# Patient Record
Sex: Male | Born: 1975 | Race: Black or African American | Hispanic: No | Marital: Single | Smoking: Never smoker
Health system: Southern US, Community
[De-identification: ages and names within clinical notes are randomized; demographics above are authoritative.]

---

## 2010-01-29 ENCOUNTER — Emergency Department (HOSPITAL_COMMUNITY): Admission: EM | Admit: 2010-01-29 | Discharge: 2010-01-29 | Payer: Self-pay | Admitting: Emergency Medicine
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
## 2017-03-01 ENCOUNTER — Emergency Department (HOSPITAL_BASED_OUTPATIENT_CLINIC_OR_DEPARTMENT_OTHER): Payer: Self-pay

## 2017-03-01 ENCOUNTER — Emergency Department (HOSPITAL_BASED_OUTPATIENT_CLINIC_OR_DEPARTMENT_OTHER)
Admission: EM | Admit: 2017-03-01 | Discharge: 2017-03-02 | Disposition: A | Discharge status: Home / Self Care | Payer: Self-pay | Attending: Emergency Medicine | Admitting: Emergency Medicine

## 2017-03-01 ENCOUNTER — Encounter (HOSPITAL_BASED_OUTPATIENT_CLINIC_OR_DEPARTMENT_OTHER): Payer: Self-pay | Admitting: *Deleted

## 2017-03-01 DIAGNOSIS — F12120 Cannabis abuse with intoxication, uncomplicated: Secondary | ICD-10-CM | POA: Insufficient documentation

## 2017-03-01 DIAGNOSIS — Z5181 Encounter for therapeutic drug level monitoring: Secondary | ICD-10-CM | POA: Insufficient documentation

## 2017-03-01 DIAGNOSIS — R55 Syncope and collapse: Secondary | ICD-10-CM

## 2017-03-01 DIAGNOSIS — F12920 Cannabis use, unspecified with intoxication, uncomplicated: Secondary | ICD-10-CM

## 2017-03-01 LAB — CBG MONITORING, ED: GLUCOSE-CAPILLARY: 73 mg/dL (ref 65–99)

## 2017-03-01 NOTE — ED Provider Notes (Signed)
[facility] Provider Note   CSN: [id number] Arrival date & time: 03/01/17  2245  By signing my name below, I, Nelwyn Salisbury, attest that this documentation has been prepared under the direction and in the presence of Franko Hilliker, MD . Electronically Signed: Nelwyn Salisbury, Scribe. 03/01/2017. 11:59 PM.  History   Chief Complaint Chief Complaint  Patient presents with  . Loss of Consciousness   The history is provided by the patient. No language interpreter was used.  Loss of Consciousness   This is a new problem. The current episode started 3 to 5 hours ago. The problem occurs rarely. The problem has not changed since onset.He lost consciousness for a period of less than one minute. The problem is associated with normal activity. Associated symptoms include light-headedness. Pertinent negatives include abdominal pain, back pain, bladder incontinence, bowel incontinence, chest pain, clumsiness, confusion, congestion, diaphoresis, dizziness, fever, focal sensory loss, focal weakness, headaches, malaise/fatigue, nausea, palpitations, seizures, slurred speech, vertigo, visual change, vomiting and weakness. He has tried nothing for the symptoms. The treatment provided no relief. His past medical history does not include CAD or CVA.    HPI Comments:  Allen Hicks is an otherwise healthy 41 y.o. male who presents to the Emergency Department having had two episodes of syncope earlier today. Pt states that he was watching television when he felt light-headed and went outside for air. He notes that he still did not feel well and so came inside to lay down when he passed out. Pt was moved to the couch by his friends, where he came to briefly before losing consciousness again. He denies any potentially triggering events or modifying factors. Pt also denies any vomiting, diarrhea, dysuria, ear pain or LE swelling. No recent hx of travel. He reports that he has been sick recently with some  cold-like symptoms. Pt's po intake has been normal today and he denies eating anything unusual.   History reviewed. No pertinent past medical history.  There are no active problems to display for this patient.   History reviewed. No pertinent surgical history.     Home Medications    Prior to Admission medications   Not on File    Family History No family history on file.  Social History Social History  Substance Use Topics  . Smoking status: Never Smoker  . Smokeless tobacco: Never Used  . Alcohol use Yes     Allergies   Patient has no known allergies.   Review of Systems Review of Systems  Constitutional: Negative for diaphoresis, fever and malaise/fatigue.  HENT: Negative for congestion and ear pain.   Respiratory: Negative for cough, choking, chest tightness, shortness of breath and stridor.   Cardiovascular: Positive for syncope. Negative for chest pain, palpitations and leg swelling.  Gastrointestinal: Negative for abdominal pain, bowel incontinence, diarrhea, nausea and vomiting.  Genitourinary: Negative for bladder incontinence and dysuria.  Musculoskeletal: Negative for back pain.  Neurological: Positive for light-headedness. Negative for dizziness, vertigo, tremors, focal weakness, seizures, syncope, facial asymmetry, speech difficulty, weakness, numbness and headaches.  Psychiatric/Behavioral: Negative for confusion.  All other systems reviewed and are negative.    Physical Exam Updated Vital Signs BP 126/70 (BP Location: Right Arm)   Pulse 82   Temp 97.8 F (36.6 C) (Oral)   Resp 18   Ht 5\' 6"  (1.676 m)   Wt 180 lb (81.6 kg)   SpO2 99%   BMI 29.05 kg/m   Physical Exam  Constitutional: He is oriented to person,  place, and time. He appears well-developed and well-nourished. No distress.  HENT:  Head: Normocephalic and atraumatic.  Mouth/Throat: Oropharynx is clear and moist. No oropharyngeal exudate.  Eyes: Conjunctivae and EOM are normal.  Pupils are equal, round, and reactive to light.  Neck: Normal range of motion. Neck supple. Carotid bruit is not present.  Cardiovascular: Normal rate, regular rhythm and normal heart sounds.   No murmur heard. Pulmonary/Chest: Effort normal and breath sounds normal. No stridor. No respiratory distress. He has no wheezes. He has no rales.  Abdominal: Soft. He exhibits no distension.  Hyperactive bowel sounds. Constipated.   Musculoskeletal: Normal range of motion. He exhibits no edema, tenderness or deformity.  All compartments soft. No cords. Negative Homan's.  Neurological: He is alert and oriented to person, place, and time. He displays normal reflexes. No cranial nerve deficit or sensory deficit. He exhibits normal muscle tone. Coordination normal.  CN II-XII intact. 5/5 Strength and Sensation throughout.   Skin: Skin is warm and dry. Capillary refill takes less than 2 seconds. He is not diaphoretic.  Psychiatric: He has a normal mood and affect.     ED Treatments / Results   Vitals:   03/01/17 2251  BP: 126/70  Pulse: 82  Resp: 18  Temp: 97.8 F (36.6 C)     DIAGNOSTIC STUDIES:  Oxygen Saturation is 99% on RA, normal by my interpretation.    COORDINATION OF CARE:  12:08 AM Discussed treatment plan with pt at bedside which includes blood work and pt agreed to plan.  Labs (all labs ordered are listed, but only abnormal results are displayed) Results for orders placed or performed during the hospital encounter of 03/01/17  CBC with Differential/Platelet  Result Value Ref Range   WBC 11.7 (H) 4.0 - 10.5 K/uL   RBC 4.95 4.22 - 5.81 MIL/uL   Hemoglobin 14.8 13.0 - 17.0 g/dL   HCT 29.5 62.1 - 30.8 %   MCV 86.5 78.0 - 100.0 fL   MCH 29.9 26.0 - 34.0 pg   MCHC 34.6 30.0 - 36.0 g/dL   RDW 65.7 84.6 - 96.2 %   Platelets 263 150 - 400 K/uL   Neutrophils Relative % 71 %   Neutro Abs 8.4 (H) 1.7 - 7.7 K/uL   Lymphocytes Relative 18 %   Lymphs Abs 2.0 0.7 - 4.0 K/uL    Monocytes Relative 9 %   Monocytes Absolute 1.0 0.1 - 1.0 K/uL   Eosinophils Relative 2 %   Eosinophils Absolute 0.2 0.0 - 0.7 K/uL   Basophils Relative 0 %   Basophils Absolute 0.0 0.0 - 0.1 K/uL  Basic metabolic panel  Result Value Ref Range   Sodium 137 135 - 145 mmol/L   Potassium 4.0 3.5 - 5.1 mmol/L   Chloride 107 101 - 111 mmol/L   CO2 23 22 - 32 mmol/L   Glucose, Bld 101 (H) 65 - 99 mg/dL   BUN 14 6 - 20 mg/dL   Creatinine, Ser 9.52 0.61 - 1.24 mg/dL   Calcium 9.1 8.9 - 84.1 mg/dL   GFR calc non Af Amer >60 >60 mL/min   GFR calc Af Amer >60 >60 mL/min   Anion gap 7 5 - 15  Troponin I  Result Value Ref Range   Troponin I <0.03 <0.03 ng/mL  Urinalysis, Routine w reflex microscopic  Result Value Ref Range   Color, Urine AMBER (A) YELLOW   APPearance CLEAR CLEAR   Specific Gravity, Urine 1.031 (H) 1.005 - 1.030  pH 5.5 5.0 - 8.0   Glucose, UA NEGATIVE NEGATIVE mg/dL   Hgb urine dipstick NEGATIVE NEGATIVE   Bilirubin Urine NEGATIVE NEGATIVE   Ketones, ur NEGATIVE NEGATIVE mg/dL   Protein, ur 30 (A) NEGATIVE mg/dL   Nitrite NEGATIVE NEGATIVE   Leukocytes, UA NEGATIVE NEGATIVE  Urinalysis, Microscopic (reflex)  Result Value Ref Range   RBC / HPF 0-5 0 - 5 RBC/hpf   WBC, UA 0-5 0 - 5 WBC/hpf   Bacteria, UA FEW (A) NONE SEEN   Squamous Epithelial / LPF 0-5 (A) NONE SEEN   Mucous PRESENT    Hyaline Casts, UA PRESENT    Granular Casts, UA PRESENT   POC CBG, ED  Result Value Ref Range   Glucose-Capillary 73 65 - 99 mg/dL   Dg Chest 2 View  Result Date: 03/02/2017 CLINICAL DATA:  Occasional cough. Syncope x2 today. History of syncopal episodes. EXAM: CHEST  2 VIEW COMPARISON:  01/29/2010 FINDINGS: The heart size and mediastinal contours are within normal limits. Both lungs are clear. The visualized skeletal structures are unremarkable. IMPRESSION: No active cardiopulmonary disease. Electronically Signed   By: Burman NievesWilliam  Stevens M.D.   On: 03/02/2017 00:01   Ct Head Wo  Contrast  Result Date: 03/01/2017 CLINICAL DATA:  Two episodes of syncope today with right frontal swelling after a fall. History of syncope but never twice in 1 day. EXAM: CT HEAD WITHOUT CONTRAST TECHNIQUE: Contiguous axial images were obtained from the base of the skull through the vertex without intravenous contrast. COMPARISON:  None. FINDINGS: Brain: No evidence of acute infarction, hemorrhage, hydrocephalus, extra-axial collection or mass lesion/mass effect. Vascular: No hyperdense vessel or unexpected calcification. Skull: Calvarium appears intact. Sinuses/Orbits: Old fracture deformity of the left medial orbital wall. Mucosal thickening throughout the paranasal sinuses. No acute air-fluid levels. Mastoid air cells are not opacified. Other: None. IMPRESSION: No acute intracranial abnormalities. Inflammatory mucosal thickening in the paranasal sinuses. Old fracture deformity of the medial left orbital wall. Electronically Signed   By: Burman NievesWilliam  Stevens M.D.   On: 03/01/2017 23:57    EKG  EKG Interpretation  Date/Time:  Tuesday March 01 2017 22:56:36 EDT Ventricular Rate:  72 PR Interval:  164 QRS Duration: 84 QT Interval:  376 QTC Calculation: 411 R Axis:   79 Text Interpretation:  Normal sinus rhythm Confirmed by [facility]  [facility], Burnell Matlin ([id number]) on 03/01/2017 11:22:21 PM       Radiology No results found.  Procedures Procedures (including critical care time)  Medications Ordered in ED  Medications  sodium chloride 0.9 % bolus 1,000 mL (1,000 mLs Intravenous New Bag/Given 03/02/17 0032)    PERC negative wells 0 highly doubt PE in this very low risk patient.  Not eating and drinking well due to cold this week likely had vasovagal episode as is orthostatic in the ED Final Clinical Impressions(s) / ED Diagnoses  Vasovagal syncope:  well appearing, normal vital signs.  I suspect the THC in the patient's system also played a part.    Based on history and exam patient has been  appropriately medically screened and emergency conditions excluded. Patient is stable for discharge at this time. Strict return precautions given for fever shortness of breath, chest pain, leg pain or swelling,  fevers, vomiting,weakness numbness difficult in making or understanding speech or any concerns.   Follow up with your PMD in 2 days for recheck   I personally performed the services described in this documentation, which was scribed in my presence. The recorded information  has been reviewed and is accurate.       Cy Blamer, MD 03/02/17 [id number]

## 2017-03-01 NOTE — ED Triage Notes (Signed)
States he got hot, light headed and passed out tonight. He hit his forehead and has a small hematoma to his right forehead. He is alert oriented. States he did not eat very much today due to low appetite.

## 2017-03-02 ENCOUNTER — Encounter (HOSPITAL_BASED_OUTPATIENT_CLINIC_OR_DEPARTMENT_OTHER): Payer: Self-pay | Admitting: Emergency Medicine

## 2017-03-02 LAB — URINALYSIS, MICROSCOPIC (REFLEX)

## 2017-03-02 LAB — URINALYSIS, ROUTINE W REFLEX MICROSCOPIC
Bilirubin Urine: NEGATIVE
Glucose, UA: NEGATIVE mg/dL
Hgb urine dipstick: NEGATIVE
Ketones, ur: NEGATIVE mg/dL
LEUKOCYTES UA: NEGATIVE
NITRITE: NEGATIVE
PH: 5.5 (ref 5.0–8.0)
Protein, ur: 30 mg/dL — AB
SPECIFIC GRAVITY, URINE: 1.031 — AB (ref 1.005–1.030)

## 2017-03-02 LAB — CBC WITH DIFFERENTIAL/PLATELET
BASOS ABS: 0 10*3/uL (ref 0.0–0.1)
BASOS PCT: 0 %
Eosinophils Absolute: 0.2 10*3/uL (ref 0.0–0.7)
Eosinophils Relative: 2 %
HEMATOCRIT: 42.8 % (ref 39.0–52.0)
HEMOGLOBIN: 14.8 g/dL (ref 13.0–17.0)
LYMPHS PCT: 18 %
Lymphs Abs: 2 10*3/uL (ref 0.7–4.0)
MCH: 29.9 pg (ref 26.0–34.0)
MCHC: 34.6 g/dL (ref 30.0–36.0)
MCV: 86.5 fL (ref 78.0–100.0)
MONO ABS: 1 10*3/uL (ref 0.1–1.0)
MONOS PCT: 9 %
NEUTROS ABS: 8.4 10*3/uL — AB (ref 1.7–7.7)
NEUTROS PCT: 71 %
Platelets: 263 10*3/uL (ref 150–400)
RBC: 4.95 MIL/uL (ref 4.22–5.81)
RDW: 13.2 % (ref 11.5–15.5)
WBC: 11.7 10*3/uL — ABNORMAL HIGH (ref 4.0–10.5)

## 2017-03-02 LAB — RAPID URINE DRUG SCREEN, HOSP PERFORMED
Amphetamines: NOT DETECTED
BARBITURATES: NOT DETECTED
Benzodiazepines: NOT DETECTED
COCAINE: NOT DETECTED
Opiates: NOT DETECTED
TETRAHYDROCANNABINOL: POSITIVE — AB

## 2017-03-02 LAB — BASIC METABOLIC PANEL
ANION GAP: 7 (ref 5–15)
BUN: 14 mg/dL (ref 6–20)
CHLORIDE: 107 mmol/L (ref 101–111)
CO2: 23 mmol/L (ref 22–32)
Calcium: 9.1 mg/dL (ref 8.9–10.3)
Creatinine, Ser: 1.2 mg/dL (ref 0.61–1.24)
GFR calc non Af Amer: >60 mL/min (ref >60)
GLUCOSE: 101 mg/dL — AB (ref 65–99)
Potassium: 4 mmol/L (ref 3.5–5.1)
Sodium: 137 mmol/L (ref 135–145)

## 2017-03-02 LAB — TROPONIN I: Troponin I: <0.03 ng/mL (ref <0.03)

## 2017-03-02 MED ORDER — SODIUM CHLORIDE 0.9 % IV BOLUS (SEPSIS)
1000.0000 mL | Freq: Once | INTRAVENOUS | Status: AC
  Administered 2017-03-02: 1000 mL via INTRAVENOUS

## 2018-03-03 IMAGING — CR DG CHEST 2V
2 series · 2 of 2 positions shown · non-contrast
Comparison: 01/29/2010

CLINICAL DATA: Occasional cough. Syncope x2 today. History of
syncopal episodes.

EXAM:
CHEST  2 VIEW

[w chest pa]
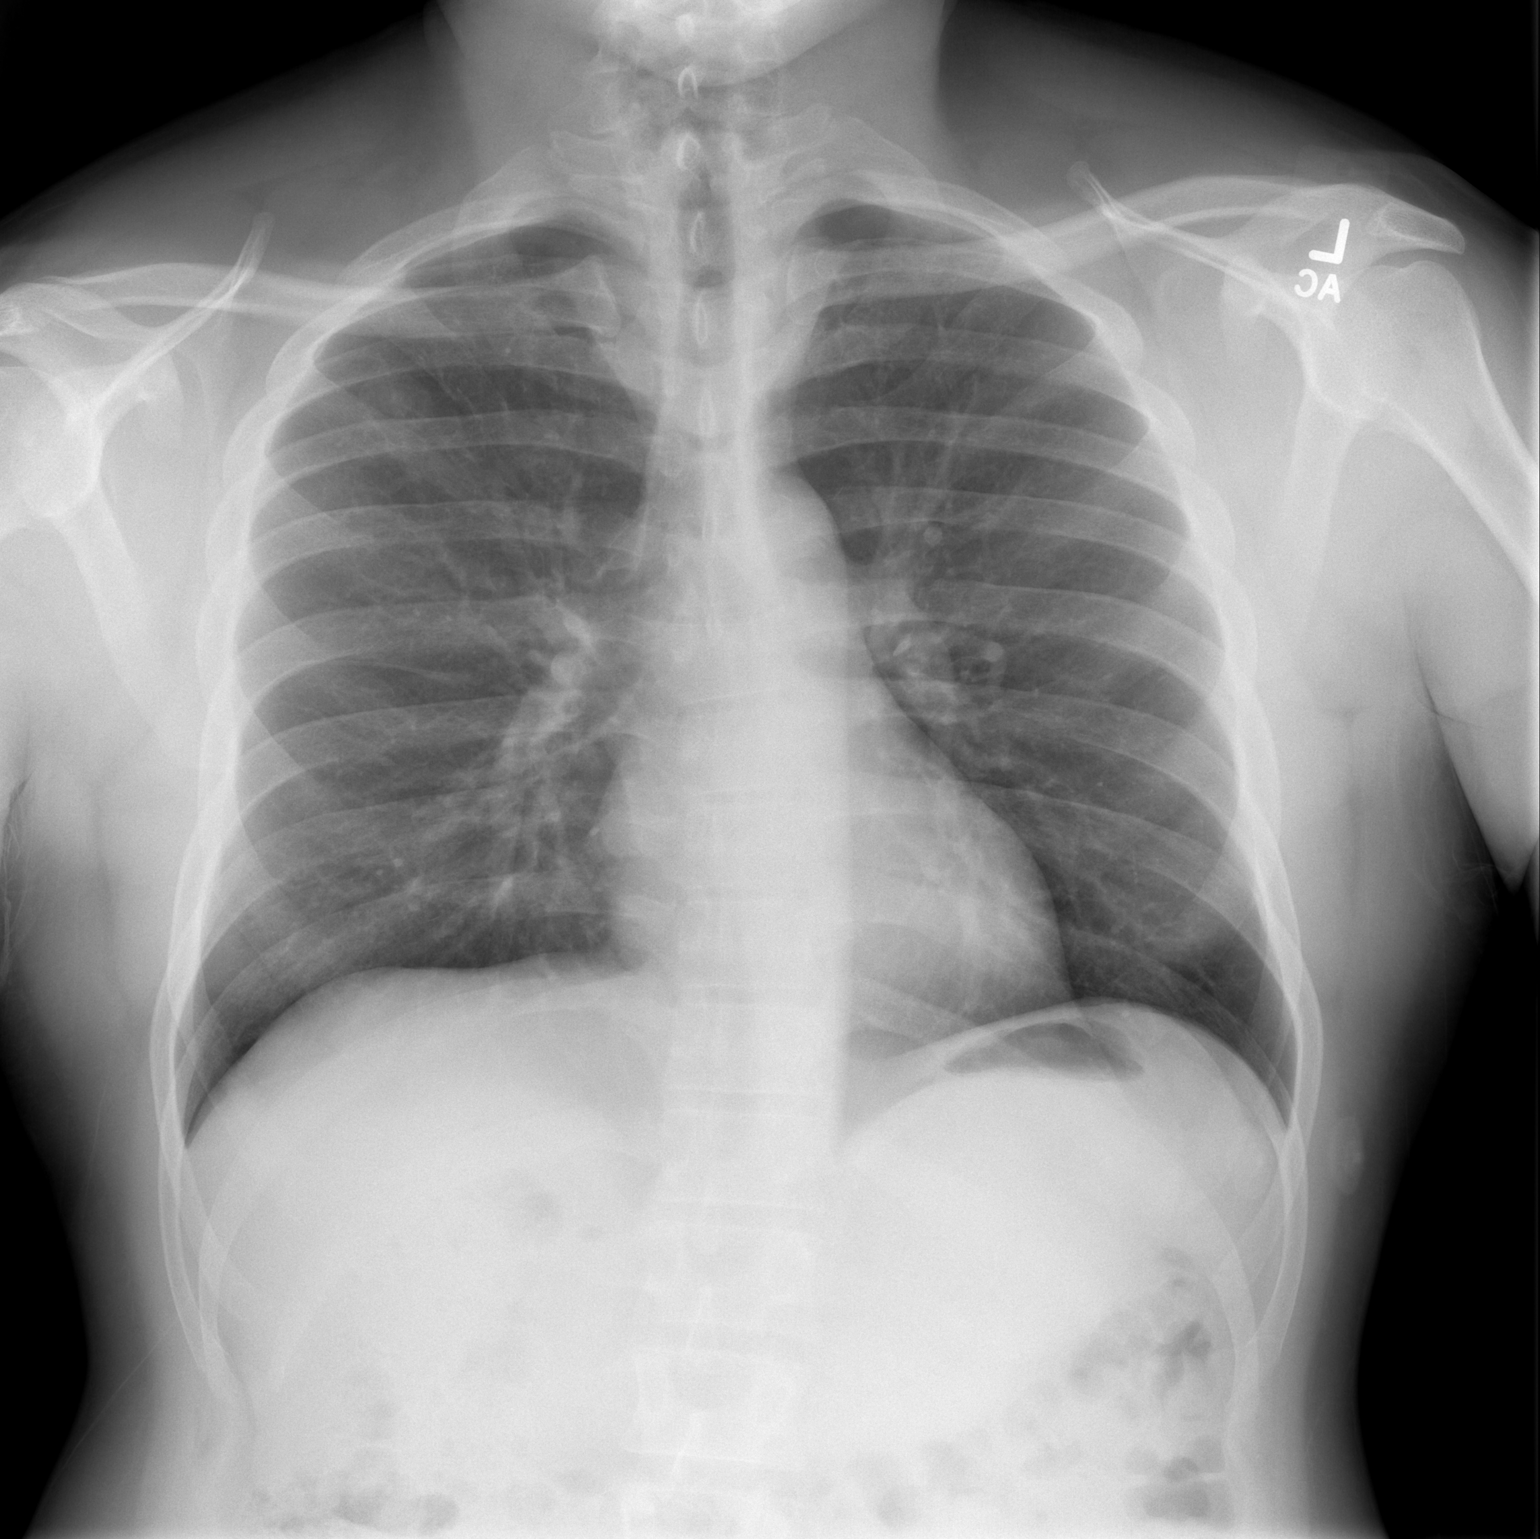

[w chest lat]
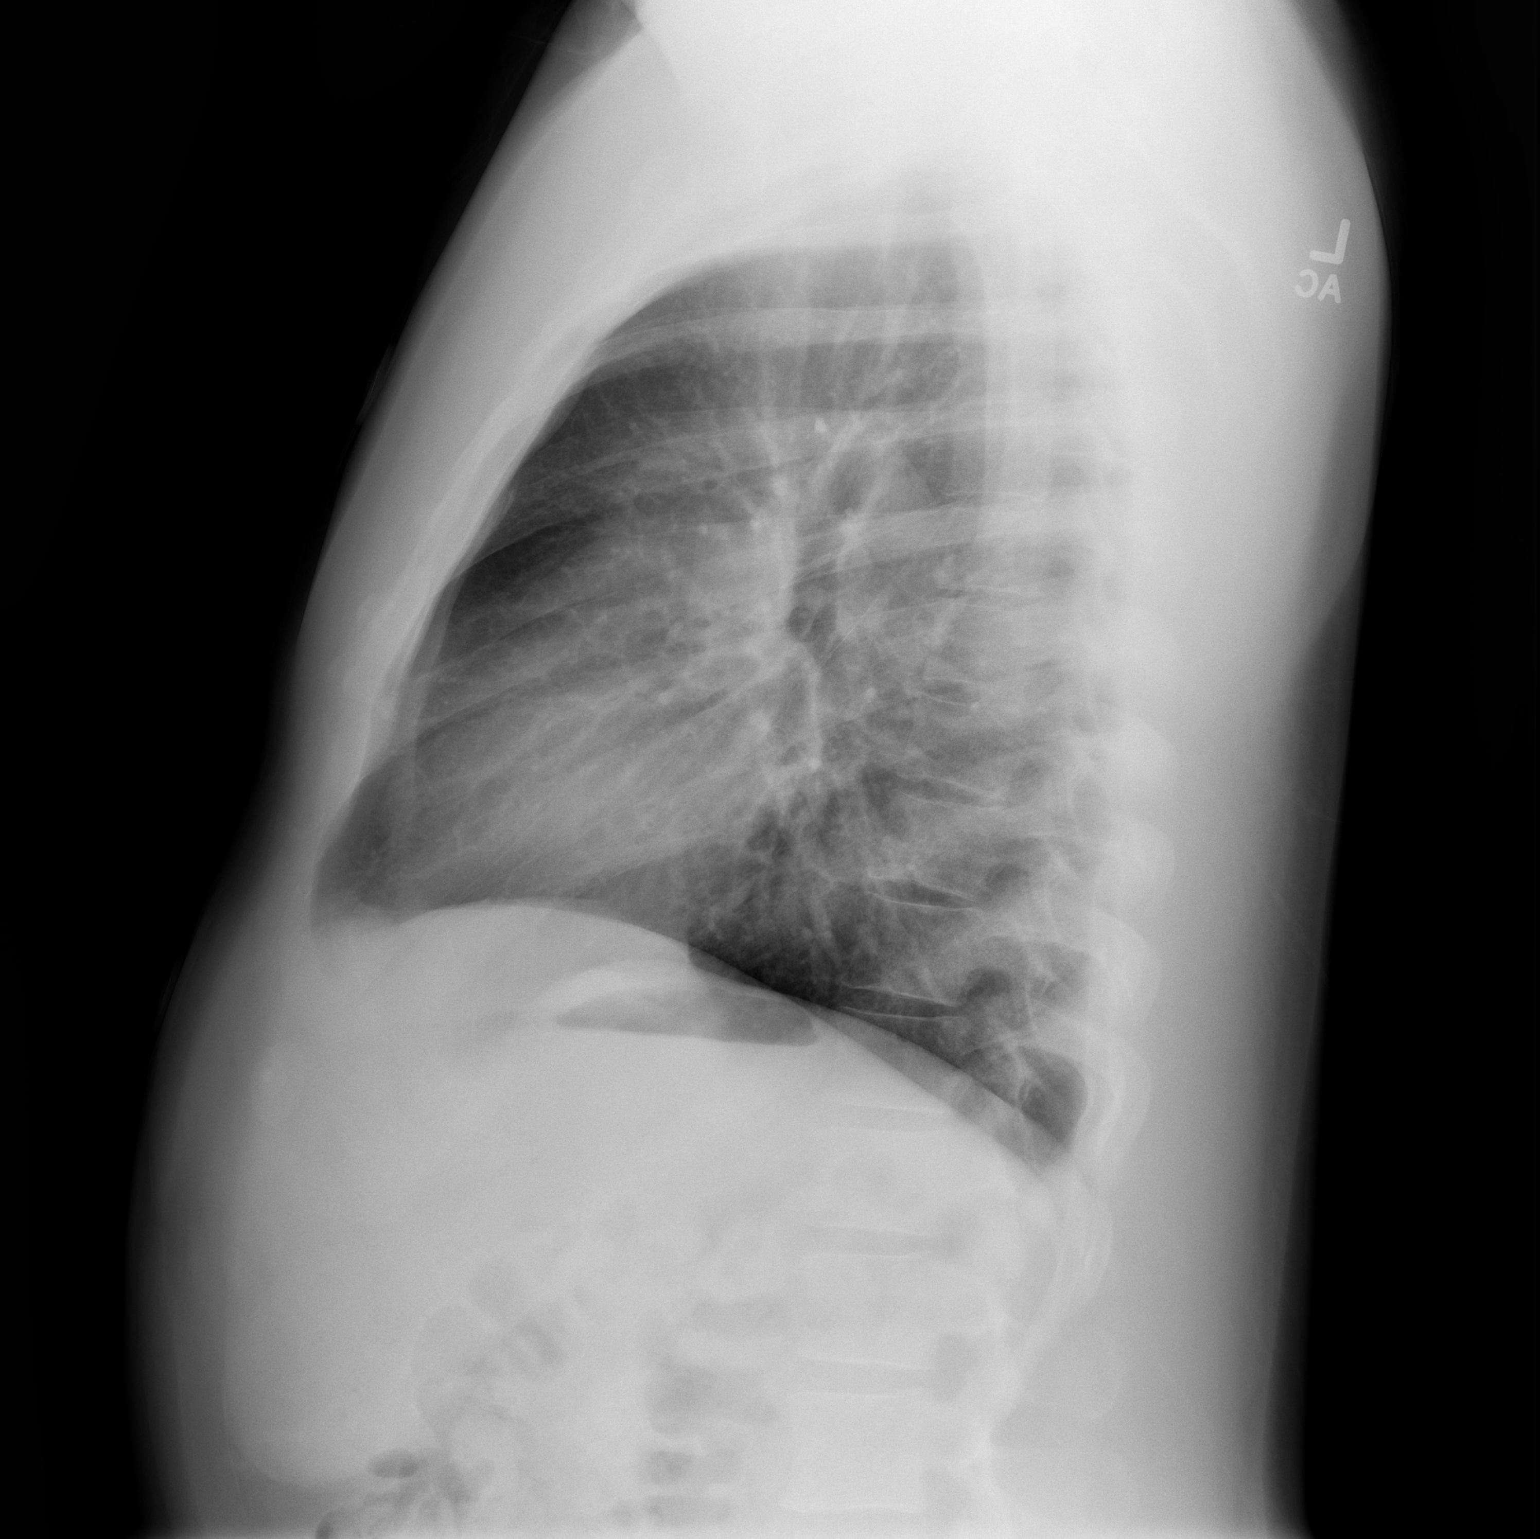

[2 of 2 positions shown; findings below may reference images not displayed]

FINDINGS: The heart size and mediastinal contours are within normal limits.
Both lungs are clear. The visualized skeletal structures are
unremarkable.
IMPRESSION: No active cardiopulmonary disease.

## 2021-10-28 ENCOUNTER — Emergency Department (HOSPITAL_BASED_OUTPATIENT_CLINIC_OR_DEPARTMENT_OTHER): Payer: Medicaid Other

## 2021-10-28 ENCOUNTER — Encounter (HOSPITAL_BASED_OUTPATIENT_CLINIC_OR_DEPARTMENT_OTHER): Payer: Self-pay | Admitting: Emergency Medicine

## 2021-10-28 ENCOUNTER — Emergency Department: Payer: Medicaid Other

## 2021-10-28 ENCOUNTER — Other Ambulatory Visit: Payer: Self-pay

## 2021-10-28 ENCOUNTER — Emergency Department (HOSPITAL_BASED_OUTPATIENT_CLINIC_OR_DEPARTMENT_OTHER)
Admission: EM | Admit: 2021-10-28 | Discharge: 2021-10-28 | Disposition: A | Discharge status: Home / Self Care | Payer: Medicaid Other | Attending: Emergency Medicine | Admitting: Emergency Medicine

## 2021-10-28 DIAGNOSIS — R1011 Right upper quadrant pain: Secondary | ICD-10-CM | POA: Insufficient documentation

## 2021-10-28 DIAGNOSIS — Z20822 Contact with and (suspected) exposure to covid-19: Secondary | ICD-10-CM | POA: Insufficient documentation

## 2021-10-28 DIAGNOSIS — J101 Influenza due to other identified influenza virus with other respiratory manifestations: Secondary | ICD-10-CM | POA: Insufficient documentation

## 2021-10-28 DIAGNOSIS — R109 Unspecified abdominal pain: Secondary | ICD-10-CM

## 2021-10-28 LAB — COMPREHENSIVE METABOLIC PANEL
ALT: 26 U/L (ref 0–44)
AST: 28 U/L (ref 15–41)
Albumin: 4.3 g/dL (ref 3.5–5.0)
Alkaline Phosphatase: 52 U/L (ref 38–126)
Anion gap: 9 (ref 5–15)
BUN: 15 mg/dL (ref 6–20)
CO2: 25 mmol/L (ref 22–32)
Calcium: 9 mg/dL (ref 8.9–10.3)
Chloride: 103 mmol/L (ref 98–111)
Creatinine, Ser: 1.24 mg/dL (ref 0.61–1.24)
GFR, Estimated: >60 mL/min (ref >60)
Glucose, Bld: 95 mg/dL (ref 70–99)
Potassium: 4.2 mmol/L (ref 3.5–5.1)
Sodium: 137 mmol/L (ref 135–145)
Total Bilirubin: 0.6 mg/dL (ref 0.3–1.2)
Total Protein: 8.5 g/dL — ABNORMAL HIGH (ref 6.5–8.1)

## 2021-10-28 LAB — CBC WITH DIFFERENTIAL/PLATELET
Abs Immature Granulocytes: 0.01 10*3/uL (ref 0.00–0.07)
Basophils Absolute: 0 10*3/uL (ref 0.0–0.1)
Basophils Relative: 1 %
Eosinophils Absolute: 0 10*3/uL (ref 0.0–0.5)
Eosinophils Relative: 0 %
HCT: 50.7 % (ref 39.0–52.0)
Hemoglobin: 17.5 g/dL — ABNORMAL HIGH (ref 13.0–17.0)
Immature Granulocytes: 0 %
Lymphocytes Relative: 50 %
Lymphs Abs: 2.1 10*3/uL (ref 0.7–4.0)
MCH: 30.1 pg (ref 26.0–34.0)
MCHC: 34.5 g/dL (ref 30.0–36.0)
MCV: 87.1 fL (ref 80.0–100.0)
Monocytes Absolute: 0.9 10*3/uL (ref 0.1–1.0)
Monocytes Relative: 21 %
Neutro Abs: 1.2 10*3/uL — ABNORMAL LOW (ref 1.7–7.7)
Neutrophils Relative %: 28 %
Platelets: 248 10*3/uL (ref 150–400)
RBC: 5.82 MIL/uL — ABNORMAL HIGH (ref 4.22–5.81)
RDW: 12.9 % (ref 11.5–15.5)
WBC: 4.2 10*3/uL (ref 4.0–10.5)
nRBC: 0 % (ref 0.0–0.2)

## 2021-10-28 LAB — URINALYSIS, ROUTINE W REFLEX MICROSCOPIC
Bilirubin Urine: NEGATIVE
Glucose, UA: NEGATIVE mg/dL
Ketones, ur: NEGATIVE mg/dL
Leukocytes,Ua: NEGATIVE
Nitrite: NEGATIVE
Protein, ur: 100 mg/dL — AB
Specific Gravity, Urine: 1.03 (ref 1.005–1.030)
pH: 5.5 (ref 5.0–8.0)

## 2021-10-28 LAB — URINALYSIS, MICROSCOPIC (REFLEX)

## 2021-10-28 LAB — RESP PANEL BY RT-PCR (FLU A&B, COVID) ARPGX2
Influenza A by PCR: POSITIVE — AB
Influenza B by PCR: NEGATIVE
SARS Coronavirus 2 by RT PCR: NEGATIVE

## 2021-10-28 LAB — LIPASE, BLOOD: Lipase: 40 U/L (ref 11–51)

## 2021-10-28 MED ORDER — FENTANYL CITRATE PF 50 MCG/ML IJ SOSY
50.0000 ug | PREFILLED_SYRINGE | Freq: Once | INTRAMUSCULAR | Status: AC
  Administered 2021-10-28: 50 ug via INTRAVENOUS
  Filled 2021-10-28: qty 1

## 2021-10-28 MED ORDER — SODIUM CHLORIDE 0.9 % IV BOLUS
1000.0000 mL | Freq: Once | INTRAVENOUS | Status: AC
  Administered 2021-10-28: 1000 mL via INTRAVENOUS

## 2021-10-28 NOTE — ED Notes (Signed)
Pt states has been taking pepto bismal for diarrhea. Also complains of cough, congestion, headache, sweats

## 2021-10-28 NOTE — ED Provider Notes (Signed)
[facility] EMERGENCY DEPARTMENT Provider Note   CSN: [id number] Arrival date & time: 10/28/21  2979     History Chief Complaint  Patient presents with   Abdominal Pain    Allen Hicks is a 45 y.o. male.  Presents to the ER for abdominal pain.  Pain ongoing for the past few days.  Also has had some associated cough, congestion, chills and feeling feverish although he did not check his temperature.  Also having some body aches and occasional headache. Did have a dark stool this morning, did take Pepto-Bismol yesterday.  Pain is currently constant, nonradiating, no alleviating or aggravating factors, worse in upper abdomen.  HPI     History reviewed. No pertinent past medical history.  There are no problems to display for this patient.   History reviewed. No pertinent surgical history.     No family history on file.  Social History   Tobacco Use   Smoking status: Never   Smokeless tobacco: Never  Substance Use Topics   Alcohol use: Yes    Comment: social   Drug use: Not Currently    Home Medications Prior to Admission medications   Not on File    Allergies    Patient has no known allergies.  Review of Systems   Review of Systems  Constitutional:  Positive for chills, fatigue and fever.  HENT:  Negative for ear pain and sore throat.   Eyes:  Negative for pain and visual disturbance.  Respiratory:  Negative for cough and shortness of breath.   Cardiovascular:  Negative for chest pain and palpitations.  Gastrointestinal:  Positive for abdominal pain and nausea. Negative for vomiting.  Genitourinary:  Negative for dysuria and hematuria.  Musculoskeletal:  Positive for arthralgias and myalgias. Negative for back pain.  Skin:  Negative for color change and rash.  Neurological:  Negative for seizures and syncope.  All other systems reviewed and are negative.  Physical Exam Updated Vital Signs BP 107/78   Pulse 61   Temp 98.3 F (36.8 C) (Oral)    Resp 16   Ht 5' 6.25" (1.683 m)   Wt 81.6 kg   SpO2 100%   BMI 28.83 kg/m   Physical Exam Vitals and nursing note reviewed.  Constitutional:      Appearance: He is well-developed.  HENT:     Head: Normocephalic and atraumatic.  Eyes:     Conjunctiva/sclera: Conjunctivae normal.  Cardiovascular:     Rate and Rhythm: Normal rate and regular rhythm.     Heart sounds: No murmur heard. Pulmonary:     Effort: Pulmonary effort is normal. No respiratory distress.     Breath sounds: Normal breath sounds.  Abdominal:     Palpations: Abdomen is soft.     Tenderness: There is abdominal tenderness in the right upper quadrant. There is no guarding or rebound.     Comments: Some tenderness in right upper quadrant  Musculoskeletal:     Cervical back: Neck supple.  Skin:    General: Skin is warm and dry.  Neurological:     Mental Status: He is alert.    ED Results / Procedures / Treatments   Labs (all labs ordered are listed, but only abnormal results are displayed) Labs Reviewed  RESP PANEL BY RT-PCR (FLU A&B, COVID) ARPGX2 - Abnormal; Notable for the following components:      Result Value   Influenza A by PCR POSITIVE (*)    All other components within normal limits  CBC WITH DIFFERENTIAL/PLATELET - Abnormal; Notable for the following components:   RBC 5.82 (*)    Hemoglobin 17.5 (*)    Neutro Abs 1.2 (*)    All other components within normal limits  COMPREHENSIVE METABOLIC PANEL - Abnormal; Notable for the following components:   Total Protein 8.5 (*)    All other components within normal limits  URINALYSIS, ROUTINE W REFLEX MICROSCOPIC - Abnormal; Notable for the following components:   Color, Urine AMBER (*)    Hgb urine dipstick SMALL (*)    Protein, ur 100 (*)    All other components within normal limits  URINALYSIS, MICROSCOPIC (REFLEX) - Abnormal; Notable for the following components:   Bacteria, UA MANY (*)    All other components within normal limits  LIPASE,  BLOOD    EKG None  Radiology US Abdomen Limited RUQ (LIVER/GB)  Result Date: 10/28/2021 CLINICAL DATA:  Abdominal pain for 2 weeks EXAM: ULTRASOUND ABDOMEN LIMITED RIGHT UPPER QUADRANT COMPARISON:  None. FINDINGS: Gallbladder: No gallstones or wall thickening visualized. No sonographic Murphy sign noted by sonographer. Common bile duct: Diameter: 5 mm Liver: There is a homogeneously echogenic lesion of the right lobe of the liver measuring 1.5 x 1.0 x 1.5 cm. Within normal limits in parenchymal echogenicity. Portal vein is patent on color Doppler imaging with normal direction of blood flow towards the liver. Other: None. IMPRESSION: 1. No acute ultrasound findings of the right upper quadrant to explain pain. Consider CT or MRI to further evaluate otherwise unexplained abdominal pain. 2. There is a 1.5 cm echogenic lesion of the right lobe of the liver, statistically most likely an incidental hemangioma although incompletely characterized. This could be characterized by multiphasic contrast enhanced CT or MRI in conjunction with above evaluation for abdominal pain if desired. Electronically Signed   By: Delanna Ahmadi M.D.   On: 10/28/2021 11:43    Procedures Procedures   Medications Ordered in ED Medications  fentaNYL (SUBLIMAZE) injection 50 mcg (50 mcg Intravenous Given 10/28/21 1056)  sodium chloride 0.9 % bolus 1,000 mL (0 mLs Intravenous Stopped 10/28/21 1203)    ED Course  I have reviewed the triage vital signs and the nursing notes.  Pertinent labs & imaging results that were available during my care of the patient were reviewed by me and considered in my medical decision making (see chart for details).    MDM Rules/Calculators/A&P                          45 year old presents to ER with concern for abdominal pain, myalgias, nausea, chills.  On exam well-appearing in no distress with normal vital signs, noted to have some tenderness in right upper quadrant but otherwise soft and  nontender abdominal exam.  Basic labs grossly stable.  Influenza a positive.  Check right upper quadrant ultrasound, negative for acute hepatobiliary pathology.  Radiologist commented on possible incidental hemangioma.  On reassessment, patient's abdominal pain had resolved and had no ongoing abdominal pain and at no point had any lower abdominal pain.  Suspect her symptoms are related to influenza A and given the reassuring work-up do not feel he needs any further CT or MRI imaging today.  Recommend follow-up with primary care, recommend supportive care for now, discharged home.  After the discussed management above, the patient was determined to be safe for discharge.  The patient was in agreement with this plan and all questions regarding their care were answered.  ED return  precautions were discussed and the patient will return to the ED with any significant worsening of condition.   Final Clinical Impression(s) / ED Diagnoses Final diagnoses:  Influenza A    Rx / DC Orders ED Discharge Orders     None        Lucrezia Starch, MD 10/29/21 [phone]

## 2021-10-28 NOTE — Discharge Instructions (Signed)
Troponin fluids.  Follow-up with primary doctor.  Come back to ER for worsening pain, vomiting, difficulty breathing or other new concerning symptom.

## 2021-10-28 NOTE — ED Notes (Signed)
Ultrasound in progress at bedside.

## 2021-10-28 NOTE — ED Triage Notes (Signed)
C/o middle abd pain x 2 wks; black stools x 3d; unable to sleep at night d/t pain; denies vomiting

## 2023-04-27 ENCOUNTER — Telehealth: Payer: Self-pay

## 2023-04-27 NOTE — Telephone Encounter (Signed)
LVM for patient to call back. AS, CMA
